# Patient Record
Sex: Female | Born: 1990
Health system: Southern US, Community
[De-identification: ages and names within clinical notes are randomized; demographics above are authoritative.]

## PROBLEM LIST (undated history)

## (undated) ENCOUNTER — Inpatient Hospital Stay (HOSPITAL_COMMUNITY): Payer: Self-pay

## (undated) DIAGNOSIS — R41840 Attention and concentration deficit: Secondary | ICD-10-CM

## (undated) HISTORY — PX: WISDOM TOOTH EXTRACTION: SHX21

---

## 2014-07-12 ENCOUNTER — Emergency Department (HOSPITAL_BASED_OUTPATIENT_CLINIC_OR_DEPARTMENT_OTHER)
Admission: EM | Admit: 2014-07-12 | Discharge: 2014-07-12 | Disposition: A | Discharge status: Home / Self Care | Payer: Managed Care, Other (non HMO) | Attending: Emergency Medicine | Admitting: Emergency Medicine

## 2014-07-12 ENCOUNTER — Encounter (HOSPITAL_BASED_OUTPATIENT_CLINIC_OR_DEPARTMENT_OTHER): Payer: Self-pay | Admitting: Emergency Medicine

## 2014-07-12 ENCOUNTER — Emergency Department (HOSPITAL_BASED_OUTPATIENT_CLINIC_OR_DEPARTMENT_OTHER): Payer: Managed Care, Other (non HMO)

## 2014-07-12 DIAGNOSIS — M79671 Pain in right foot: Secondary | ICD-10-CM | POA: Insufficient documentation

## 2014-07-12 DIAGNOSIS — Z87828 Personal history of other (healed) physical injury and trauma: Secondary | ICD-10-CM | POA: Diagnosis not present

## 2014-07-12 HISTORY — DX: Attention and concentration deficit: R41.840

## 2014-07-12 MED ORDER — IBUPROFEN 600 MG PO TABS: 600.0000 mg | ORAL_TABLET | Freq: Four times a day (QID) | ORAL | Status: DC | PRN

## 2014-07-12 NOTE — ED Provider Notes (Signed)
CSN: [id number]     Arrival date & time 07/12/14  2000 History   First MD Initiated Contact with Patient 07/12/14 2239     Chief Complaint  Patient presents with  . Foot Pain     (Consider location/radiation/quality/duration/timing/severity/associated sxs/prior Treatment) HPI Comments: Patient presents with complaint of right foot pain for the past 4 days. She denies injury. 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 Pain is worse with bearing weight and she states that she is walking on the back of her foot. She has a history of a previous sprain in that foot and ankle. She's been taking ibuprofen and Tylenol without relief. She has been elevating it at night. She has not had appreciable swelling. Pain worsens during the day with activity. No numbness, tingling, weakness in her foot.   Patient is a 23 y.o. female presenting with lower extremity pain. The history is provided by the patient.  Foot Pain Associated symptoms include arthralgias. Pertinent negatives include no joint swelling, numbness or weakness.    Past Medical History  Diagnosis Date  . Attention deficit    History reviewed. No pertinent past surgical history. No family history on file. History  Substance Use Topics  . Smoking status: Never Smoker   . Smokeless tobacco: Not on file  . Alcohol Use: No   OB History   Grav Para Term Preterm Abortions TAB SAB Ect Mult Living                 Review of Systems  Constitutional: Negative for activity change.  Musculoskeletal: Positive for arthralgias and gait problem. Negative for back pain and joint swelling.  Skin: Negative for wound.  Neurological: Negative for weakness and numbness.      Allergies  Review of patient's allergies indicates no known allergies.  Home Medications   Prior to Admission medications   Medication Sig Start Date End Date Taking? Authorizing Provider  ibuprofen (ADVIL,MOTRIN) 600 MG tablet Take 1 tablet (600 mg total)  by mouth every 6 (six) hours as needed. 07/12/14   Renne CriglerJoshua Dvonte Gatliff, PA-C   BP 122/74  Pulse 93  Temp(Src) 98.1 F (36.7 C) (Oral)  Resp 20  Ht 5\' 5"  (1.651 m)  Wt 160 lb (72.576 kg)  BMI 26.63 kg/m2  SpO2 100%  LMP 06/28/2014 Physical Exam  Nursing note and vitals reviewed. Constitutional: She appears well-developed and well-nourished.  HENT:  Head: Normocephalic and atraumatic.  Eyes: Pupils are equal, round, and reactive to light.  Neck: Normal range of motion. Neck supple.  Cardiovascular: Exam reveals no decreased pulses.   Pulses:      Dorsalis pedis pulses are 2+ on the right side.       Posterior tibial pulses are 2+ on the right side.  Musculoskeletal: She exhibits tenderness. She exhibits no edema.       Right knee: Normal.       Right ankle: Normal. No tenderness.       Right lower leg: Normal.       Right foot: She exhibits tenderness. She exhibits normal range of motion, no swelling, normal capillary refill and no deformity.       Feet:  Neurological: She is alert. No sensory deficit.  Motor, sensation, and vascular distal to the injury is fully intact.   Skin: Skin is warm and dry.  Psychiatric: She has a normal mood and affect.    ED Course  Procedures (including critical care time) Labs Review  Labs Reviewed - No data to display  Imaging Review Dg Foot Complete Right  07/12/2014   CLINICAL DATA:  Right foot pain beginning 07/08/2014. No known injury. Initial encounter.  EXAM: RIGHT FOOT COMPLETE - 3+ VIEW  COMPARISON:  None.  FINDINGS: Imaged bones, joints and soft tissues appear normal.  IMPRESSION: Negative exam.   Electronically Signed   By: Drusilla Kannerhomas  Dalessio M.D.   On: 07/12/2014 21:00     EKG Interpretation None      Patient seen and examined.    Vital signs reviewed and are as follows: BP 122/74  Pulse 93  Temp(Src) 98.1 F (36.7 C) (Oral)  Resp 20  Ht 5\' 5"  (1.651 m)  Wt 160 lb (72.576 kg)  BMI 26.63 kg/m2  SpO2 100%  LMP  06/28/2014  Informed of x-ray results. Counseled on rice protocol and NSAIDs. Encouraged orthopedic followup for definitive care and management.  MDM   Final diagnoses:  Right foot pain   Patient with right foot pain, likely inflammatory due to overuse. No signs of cellulitis. Doubt plantar fasciitis. X-rays negative. Continue NSAIDs, rice protocol with PCP/orthopedic followup. Foot is neurovascularly intact. No orthopedic emergency suspected.    Renne CriglerJoshua Shadai Mcclane, PA-C 07/13/14 0002

## 2014-07-12 NOTE — Discharge Instructions (Signed)
Please read and follow all provided instructions.  Your diagnoses today include:  1. Right foot pain     Tests performed today include:  An x-ray of the affected area - does NOT show any broken bones  Vital signs. See below for your results today.   Medications prescribed:   Ibuprofen (Motrin, Advil) - anti-inflammatory pain medication  Do not exceed 600mg  ibuprofen every 6 hours, take with food  You have been prescribed an anti-inflammatory medication or NSAID. Take with food. Take smallest effective dose for the shortest duration needed for your pain. Stop taking if you experience stomach pain or vomiting.   Take any prescribed medications only as directed.  Home care instructions:   Follow any educational materials contained in this packet  Follow R.I.C.E. Protocol:  R - rest your injury   I  - use ice on injury without applying directly to skin  C - compress injury with bandage or splint  E - elevate the injury as much as possible  Follow-up instructions: Please follow-up with your primary care provider or the provided orthopedic physician (bone specialist) if you continue to have significant pain or trouble walking in 1 week. In this case you may have a severe injury that requires further care.   Return instructions:   Please return if your toes are numb or tingling, appear gray or blue, or you have severe pain (also elevate leg and loosen splint or wrap if you were given one)  Please return to the Emergency Department if you experience worsening symptoms.   Please return if you have any other emergent concerns.  Additional Information:  Your vital signs today were: BP 122/74   Pulse 93   Temp(Src) 98.1 F (36.7 C) (Oral)   Resp 20   Ht 5\' 5"  (1.651 m)   Wt 160 lb (72.576 kg)   BMI 26.63 kg/m2   SpO2 100%   LMP 06/28/2014 If your blood pressure (BP) was elevated above 135/85 this visit, please have this repeated by your doctor within one  month. --------------

## 2014-07-12 NOTE — ED Notes (Signed)
Right foot pain x 4 days. No known injury.

## 2014-07-13 NOTE — ED Provider Notes (Signed)
Medical screening examination/treatment/procedure(s) were performed by non-physician practitioner and as supervising physician I was immediately available for consultation/collaboration.   EKG Interpretation None       Gavyn Ybarra, MD 07/13/14 0121 

## 2016-10-17 ENCOUNTER — Emergency Department (HOSPITAL_BASED_OUTPATIENT_CLINIC_OR_DEPARTMENT_OTHER): Payer: Managed Care, Other (non HMO)

## 2016-10-17 ENCOUNTER — Encounter (HOSPITAL_BASED_OUTPATIENT_CLINIC_OR_DEPARTMENT_OTHER): Payer: Self-pay | Admitting: Emergency Medicine

## 2016-10-17 ENCOUNTER — Emergency Department (HOSPITAL_BASED_OUTPATIENT_CLINIC_OR_DEPARTMENT_OTHER)
Admission: EM | Admit: 2016-10-17 | Discharge: 2016-10-17 | Disposition: A | Discharge status: Home / Self Care | Payer: Managed Care, Other (non HMO) | Attending: Emergency Medicine | Admitting: Emergency Medicine

## 2016-10-17 DIAGNOSIS — M5441 Lumbago with sciatica, right side: Secondary | ICD-10-CM | POA: Diagnosis not present

## 2016-10-17 DIAGNOSIS — M545 Low back pain: Secondary | ICD-10-CM | POA: Diagnosis present

## 2016-10-17 LAB — URINALYSIS, ROUTINE W REFLEX MICROSCOPIC
Bilirubin Urine: NEGATIVE
GLUCOSE, UA: NEGATIVE mg/dL
KETONES UR: NEGATIVE mg/dL
Nitrite: NEGATIVE
PROTEIN: NEGATIVE mg/dL
Specific Gravity, Urine: 1.017 (ref 1.005–1.030)
pH: 6 (ref 5.0–8.0)

## 2016-10-17 LAB — URINALYSIS, MICROSCOPIC (REFLEX)

## 2016-10-17 LAB — PREGNANCY, URINE: PREG TEST UR: NEGATIVE

## 2016-10-17 MED ORDER — KETOROLAC TROMETHAMINE 30 MG/ML IJ SOLN
30.0000 mg | Freq: Once | INTRAMUSCULAR | Status: AC
  Administered 2016-10-17: 30 mg via INTRAMUSCULAR
  Filled 2016-10-17: qty 1

## 2016-10-17 MED ORDER — PREDNISONE 20 MG PO TABS
40.0000 mg | ORAL_TABLET | Freq: Once | ORAL | Status: AC
  Administered 2016-10-17: 40 mg via ORAL
  Filled 2016-10-17: qty 2

## 2016-10-17 MED ORDER — HYDROCODONE-ACETAMINOPHEN 5-325 MG PO TABS: 1.0000 | ORAL_TABLET | Freq: Four times a day (QID) | ORAL | 0 refills | Status: DC | PRN

## 2016-10-17 MED ORDER — HYDROCODONE-ACETAMINOPHEN 5-325 MG PO TABS
1.0000 | ORAL_TABLET | Freq: Once | ORAL | Status: AC
  Administered 2016-10-17: 1 via ORAL
  Filled 2016-10-17: qty 1

## 2016-10-17 MED ORDER — PREDNISONE 20 MG PO TABS: 40.0000 mg | ORAL_TABLET | Freq: Every day | ORAL | 0 refills | Status: DC

## 2016-10-17 NOTE — ED Provider Notes (Signed)
[facility]   CSN: [id number] Arrival date & time: 10/17/16  1408     History   Chief Complaint Chief Complaint  Patient presents with  . Back Pain    HPI Cheryl Lambert is a 26 y.o. female.  26 year old Caucasian female with no significant past medical history presents to the ED today with complaint of right-sided low back pain. Patient states that her symptoms started approximately 3 days ago. The pain radiates to her right buttocks and down her right leg. She denies any injury. Patient states that she had similar episode beginning of December. She saw a chiropractor at that time. Symptoms resolved and then returned 3 days ago. The patient has tried a muscle relaxer she had a home with out any relief. Patient denies any heavy lifting or straining at work. Moving, stepping, bending, twisting makes the pain worse. Sitting on her right side makes the pain worse. Nothing makes the pain better. She denies any fevers, urinary retention, loss of bowel or bladder, saddle paresthesias, lower extremity weakness, lower extremity paresthesias, night sweats, history of IV drug use, history of cancer. Patient denies any fever, chills, headache, vision changes, lightheadedness, dizziness, chest pain, shortness of breath, abdominal pain, nausea, emesis, urinary symptoms, change in bowel habits, vaginal bleeding, vaginal discharge, lower show any numbness or tingling.      Past Medical History:  Diagnosis Date  . Attention deficit     There are no active problems to display for this patient.   History reviewed. No pertinent surgical history.  OB History    No data available       Home Medications    Prior to Admission medications   Medication Sig Start Date End Date Taking? Authorizing Provider  lisdexamfetamine (VYVANSE) 20 MG capsule Take 20 mg by mouth daily.   Yes Historical Provider, MD  ibuprofen (ADVIL,MOTRIN) 600 MG tablet Take 1 tablet (600 mg total) by  mouth every 6 (six) hours as needed. 07/12/14   Renne Crigler, PA-C    Family History No family history on file.  Social History Social History  Substance Use Topics  . Smoking status: Never Smoker  . Smokeless tobacco: Never Used  . Alcohol use No     Allergies   Patient has no known allergies.   Review of Systems Review of Systems  Constitutional: Negative for chills and fever.  Eyes: Negative for visual disturbance.  Respiratory: Negative for cough and shortness of breath.   Cardiovascular: Negative for chest pain and leg swelling.  Gastrointestinal: Negative for abdominal pain, diarrhea, nausea and vomiting.  Genitourinary: Negative for dysuria, flank pain, frequency, hematuria and urgency.  Musculoskeletal: Positive for back pain. Negative for neck pain and neck stiffness. Arthralgias: right lumbar region.  Skin: Negative for color change.  Neurological: Negative for dizziness, syncope, weakness, light-headedness, numbness and headaches.  All other systems reviewed and are negative.    Physical Exam Updated Vital Signs BP 128/97 (BP Location: Left Arm)   Pulse 115   Temp 98.2 F (36.8 C) (Oral)   Resp 20   Ht 5\' 5"  (1.651 m)   Wt 81.6 kg   LMP 10/03/2016   SpO2 100%   BMI 29.95 kg/m   Physical Exam  Constitutional: She is oriented to person, place, and time. She appears well-developed and well-nourished. No distress.  HENT:  Head: Normocephalic and atraumatic.  Eyes: Pupils are equal, round, and reactive to light. Right eye exhibits no discharge. Left eye exhibits no discharge.  No scleral icterus.  Neck: Normal range of motion. Neck supple.  No midline C-spine tenderness. Full range of motion. No deformities or step-offs noted.  Cardiovascular: Regular rhythm and intact distal pulses.   Patient was tachycardic in triage but on my exam heart rate was 103. This is likely due to pain.  Pulmonary/Chest: Effort normal and breath sounds normal. No respiratory  distress.  Abdominal: Soft. Bowel sounds are normal. She exhibits no distension. There is no tenderness. There is no rebound and no guarding.  Musculoskeletal: Normal range of motion.  No midline T-spine or L-spine tenderness. No deformities or step-offs noted. Full range of motion. She does have tenderness to palpation of the right lumbar paraspinal musculature. Pain radiates down to the right buttocks. She has a positive straight leg raise test on the right that reproduces pain that shoots down her buttocks. Pain with movement. Patient is able to ambulate with normal gait. Strength 5 out of 5 in lower extremities bilaterally. Cap refill normal. Sensation intact.  Lymphadenopathy:    She has no cervical adenopathy.  Neurological: She is alert and oriented to person, place, and time.  Skin: Skin is warm and dry. Capillary refill takes less than 2 seconds. No pallor.  Nursing note and vitals reviewed.    ED Treatments / Results  Labs (all labs ordered are listed, but only abnormal results are displayed) Labs Reviewed  URINALYSIS, ROUTINE W REFLEX MICROSCOPIC - Abnormal; Notable for the following:       Result Value   APPearance CLOUDY (*)    Hgb urine dipstick SMALL (*)    Leukocytes, UA SMALL (*)    All other components within normal limits  URINALYSIS, MICROSCOPIC (REFLEX) - Abnormal; Notable for the following:    Bacteria, UA RARE (*)    Squamous Epithelial / LPF 0-5 (*)    All other components within normal limits  PREGNANCY, URINE    EKG  EKG Interpretation None       Radiology Dg Lumbar Spine Complete  Result Date: 10/17/2016 CLINICAL DATA:  Patient c/o right sided back pains since 10/14/16, denies any known injury, states that she has previously had back pain off/on but this has been more severe EXAM: LUMBAR SPINE - COMPLETE 4+ VIEW COMPARISON:  None. FINDINGS: There is dextroscoliosis of the lumbar spine, measuring approximately 20 degrees. Osseous alignment is otherwise  normal. Bone mineralization is normal. No fracture line or displaced fracture fragment identified. No acute or suspicious osseous lesion. No evidence of pars interarticularis defect. No degenerative change. Paravertebral soft tissues are unremarkable. Fairly large amount of stool and gas noted in the right colon. IMPRESSION: 1. Scoliosis, approximately 20 degrees. No acute osseous abnormality seen. 2. Fairly large amount of stool within the right colon (constipation? ). Electronically Signed   By: Bary Richard M.D.   On: 10/17/2016 15:36    Procedures Procedures (including critical care time)  Medications Ordered in ED Medications  HYDROcodone-acetaminophen (NORCO/VICODIN) 5-325 MG per tablet 1 tablet (1 tablet Oral Given 10/17/16 1519)  ketorolac (TORADOL) 30 MG/ML injection 30 mg (30 mg Intramuscular Given 10/17/16 1551)  predniSONE (DELTASONE) tablet 40 mg (40 mg Oral Given 10/17/16 1603)     Initial Impression / Assessment and Plan / ED Course  I have reviewed the triage vital signs and the nursing notes.  Pertinent labs & imaging results that were available during my care of the patient were reviewed by me and considered in my medical decision making (see chart for details).  Clinical Course   Patient with back pain.  No neurological deficits and normal neuro exam.  Patient can walk but states is painful.  No loss of bowel or bladder control.  No concern for cauda equina.  No fever, night sweats, weight loss, h/o cancer, IVDU.  Moving makes the pain worse. Patient has history and seeing chiropractor for same. Patient denies any urinary symptoms or vaginal symptoms. She denies any abdominal pains or fever. Urine without signs of infection. Small amount of hemoglobin was noted. Pain possibly due to kidney stone however patient has no urinary symptoms. She denies any nausea or vomiting. Pain is worse with palpation of the right paraspinal muscular of the lumbar spine. Likely sciatic pain. X-ray  reveals no acute bony amount. She does have scoliosis noted. Pt has history. X-ray also notes large amount of stool. Patient was given pain medicine and Toradol in the ED. She was discharged home with a course of prednisone. Patient was slightly tachycardic in the ED. Heart rate on discharge 106. Reviewed patient's prior visits and has had an elevated heart rate at that time. This is likely due to pain. Patient is afebrile in the ED. RICE protocol and pain medicine indicated and discussed with patient. Encouraged patient to return to the ED if she develops any urinary symptoms, fever, nausea, vomiting, abdominal pain. I have encouraged her follow with her primary care doctor. Pt is hemodynamically stable, in NAD, & able to ambulate in the ED. Pain has been managed & has no complaints prior to dc. Pt is comfortable with above plan and is stable for discharge at this time. All questions were answered prior to disposition. Strict return precautions for f/u to the ED were discussed.    Final Clinical Impressions(s) / ED Diagnoses   Final diagnoses:  Acute right-sided low back pain with right-sided sciatica    New Prescriptions Discharge Medication List as of 10/17/2016  4:01 PM    START taking these medications   Details  HYDROcodone-acetaminophen (NORCO/VICODIN) 5-325 MG tablet Take 1-2 tablets by mouth every 6 (six) hours as needed., Starting Wed 10/17/2016, Print    predniSONE (DELTASONE) 20 MG tablet Take 2 tablets (40 mg total) by mouth daily with breakfast., Starting Wed 10/17/2016, Print         Rise MuKenneth T Leaphart, PA-C 10/17/16 1619    Vanetta MuldersScott Zackowski, MD 10/23/16 1735

## 2016-10-17 NOTE — ED Notes (Signed)
ED Provider at bedside. 

## 2016-10-17 NOTE — ED Notes (Signed)
Patient transported to X-ray 

## 2016-10-17 NOTE — Discharge Instructions (Signed)
Your urine shows no signs of infection. Your x-ray shows no fractures. Does show scoliosis of the lumbar spine. There are is moderate amount of stool in her colon. I would encourage MiraLAX in stool softener to help with constipation. Please take the prednisone as prescribed for the next 4 days starting tomorrow. You  may take the pain medicine as needed. Please use a heating pad or soak in warm water with Epsom salt. You may take ibuprofen starting tomorrow. Follow up with her primary care doctor. Return to the ED if he develops any urinary symptoms, fever, abdominal pain, vaginal symptoms or for the reason.

## 2016-10-17 NOTE — ED Triage Notes (Signed)
R low back pain since Sunday, radiating into buttock, denies injury.

## 2018-06-01 ENCOUNTER — Encounter (HOSPITAL_BASED_OUTPATIENT_CLINIC_OR_DEPARTMENT_OTHER): Payer: Self-pay | Admitting: Emergency Medicine

## 2018-06-01 ENCOUNTER — Emergency Department (HOSPITAL_BASED_OUTPATIENT_CLINIC_OR_DEPARTMENT_OTHER)
Admission: EM | Admit: 2018-06-01 | Discharge: 2018-06-01 | Disposition: A | Discharge status: Home / Self Care | Payer: 59 | Attending: Emergency Medicine | Admitting: Emergency Medicine

## 2018-06-01 ENCOUNTER — Other Ambulatory Visit: Payer: Self-pay

## 2018-06-01 DIAGNOSIS — Y999 Unspecified external cause status: Secondary | ICD-10-CM | POA: Insufficient documentation

## 2018-06-01 DIAGNOSIS — X501XXA Overexertion from prolonged static or awkward postures, initial encounter: Secondary | ICD-10-CM | POA: Insufficient documentation

## 2018-06-01 DIAGNOSIS — Y93K1 Activity, walking an animal: Secondary | ICD-10-CM | POA: Insufficient documentation

## 2018-06-01 DIAGNOSIS — S3992XA Unspecified injury of lower back, initial encounter: Secondary | ICD-10-CM | POA: Diagnosis present

## 2018-06-01 DIAGNOSIS — S39012A Strain of muscle, fascia and tendon of lower back, initial encounter: Secondary | ICD-10-CM | POA: Insufficient documentation

## 2018-06-01 DIAGNOSIS — Y929 Unspecified place or not applicable: Secondary | ICD-10-CM | POA: Insufficient documentation

## 2018-06-01 DIAGNOSIS — Z79899 Other long term (current) drug therapy: Secondary | ICD-10-CM | POA: Diagnosis not present

## 2018-06-01 MED ORDER — CYCLOBENZAPRINE HCL 10 MG PO TABS
10.0000 mg | ORAL_TABLET | Freq: Once | ORAL | Status: AC
  Administered 2018-06-01: 10 mg via ORAL
  Filled 2018-06-01: qty 1

## 2018-06-01 MED ORDER — IBUPROFEN 800 MG PO TABS
800.0000 mg | ORAL_TABLET | Freq: Once | ORAL | Status: AC
  Administered 2018-06-01: 800 mg via ORAL
  Filled 2018-06-01: qty 1

## 2018-06-01 MED ORDER — CYCLOBENZAPRINE HCL 10 MG PO TABS: 10.0000 mg | ORAL_TABLET | Freq: Two times a day (BID) | ORAL | 0 refills | Status: DC | PRN

## 2018-06-01 MED ORDER — ACETAMINOPHEN 325 MG PO TABS
650.0000 mg | ORAL_TABLET | Freq: Once | ORAL | Status: AC
  Administered 2018-06-01: 650 mg via ORAL
  Filled 2018-06-01: qty 2

## 2018-06-01 NOTE — ED Triage Notes (Signed)
Low back pain since 1230 after bending over.

## 2018-06-01 NOTE — Discharge Instructions (Signed)
You have strained the muscles in your lower back.  Take 800 mg ibuprofen every 6 hours for pain.  He can also take Tylenol every 6 hours.  Apply ice over your left lower back at least 3 times a day for 15 minutes at a time.  Engage in back exercises, I have attached an information guide to this paperwork.  I have written you prescription for muscle relaxer, remember that this medicine makes you drowsy and you should not drive, work or drink alcohol while taking it.  Follow-up with your regular doctor if your symptoms are not improving.  Return to the emergency department if you have any new or concerning symptoms like numbness in your feet or legs, weakness, loss of bowel or bladder control.

## 2018-06-01 NOTE — ED Provider Notes (Signed)
[facility] EMERGENCY DEPARTMENT Provider Note   CSN: [id number] Arrival date & time: 06/01/18  1712     History   Chief Complaint Chief Complaint  Patient presents with  . Back Pain    HPI Cheryl Lambert is a 27 y.o. female.  HPI   Cheryl Lambert is a 27yo female with no significant past medical history who presents to the emergency department for evaluation of right-sided lower back pain.  Patient reports that she was bending over to put a lesion on her dog when she suddenly felt a sharp sensation in her midline lower back.  Since that time she has had pain over the right lower back.  Pain feels sharp and tight.  Is an 8-9/10 in severity.  Worsened with movement including walking, bending, twisting at the hip.  She tried a heating pad but this did not help her symptoms.  No over-the-counter medications.  Denies numbness, weakness, loss of bowel or bladder control, saddle anesthesia, radiation down the legs, fever, chills, dysuria, urinary frequency, hematuria, abdominal pain, vomiting. No hx of cancer or ivdu.   Past Medical History:  Diagnosis Date  . Attention deficit     There are no active problems to display for this patient.   History reviewed. No pertinent surgical history.   OB History   None      Home Medications    Prior to Admission medications   Medication Sig Start Date End Date Taking? Authorizing Provider  HYDROcodone-acetaminophen (NORCO/VICODIN) 5-325 MG tablet Take 1-2 tablets by mouth every 6 (six) hours as needed. 10/17/16   Rise Mu, PA-C  ibuprofen (ADVIL,MOTRIN) 600 MG tablet Take 1 tablet (600 mg total) by mouth every 6 (six) hours as needed. 07/12/14   Renne Crigler, PA-C  lisdexamfetamine (VYVANSE) 20 MG capsule Take 20 mg by mouth daily.    [provider]  predniSONE (DELTASONE) 20 MG tablet Take 2 tablets (40 mg total) by mouth daily with breakfast. 10/17/16   Leaphart, Lynann Beaver, PA-C    Family History No family  history on file.  Social History Social History   Tobacco Use  . Smoking status: Never Smoker  . Smokeless tobacco: Never Used  Substance Use Topics  . Alcohol use: No  . Drug use: No     Allergies   Patient has no known allergies.   Review of Systems Review of Systems  Constitutional: Negative for chills and fever.  Gastrointestinal: Negative for abdominal pain, nausea and vomiting.  Genitourinary: Negative for difficulty urinating, dysuria, flank pain, frequency and hematuria.  Musculoskeletal: Positive for back pain and gait problem (painful).  Skin: Negative for color change.  Neurological: Negative for weakness and numbness.  Psychiatric/Behavioral: Negative for agitation.     Physical Exam Updated Vital Signs BP (!) 140/99 (BP Location: Right Arm)   Pulse 97   Temp 97.7 F (36.5 C) (Oral)   Resp 20   Ht 5\' 5"  (1.651 m)   Wt 86.2 kg   LMP 05/31/2018   SpO2 100%   BMI 31.62 kg/m   Physical Exam  Constitutional: She appears well-developed and well-nourished. No distress.  NAD  HENT:  Head: Normocephalic and atraumatic.  Eyes: Right eye exhibits no discharge. Left eye exhibits no discharge.  Pulmonary/Chest: Effort normal. No respiratory distress.  Abdominal: Soft. Bowel sounds are normal. There is no tenderness. There is no guarding.  No CVA tenderness.   Musculoskeletal:  No midline t-spine or l-spine tenderness. Tender to palpation over right-sided  paraspinal muscles of the lumbar spine. No overlying rash or bruising. Strength 5/5 in bilateral knee flexion/extension and ankle dorsiflexion/plantarflexion. DP pulses 2+ and symmetric bilaterally.   Neurological: She is alert. Coordination normal.  Gait normal in coordination and balance. Sensation to light touch intact in distal bilateral LE.   Skin: Skin is warm and dry. Capillary refill takes less than 2 seconds. She is not diaphoretic.  Psychiatric: She has a normal mood and affect. Her behavior is  normal.  Nursing note and vitals reviewed.   ED Treatments / Results  Labs (all labs ordered are listed, but only abnormal results are displayed) Labs Reviewed - No data to display  EKG None  Radiology No results found.  Procedures Procedures (including critical care time)  Medications Ordered in ED Medications  cyclobenzaprine (FLEXERIL) tablet 10 mg (10 mg Oral Given 06/01/18 1823)  ibuprofen (ADVIL,MOTRIN) tablet 800 mg (800 mg Oral Given 06/01/18 1824)  acetaminophen (TYLENOL) tablet 650 mg (650 mg Oral Given 06/01/18 1823)     Initial Impression / Assessment and Plan / ED Course  I have reviewed the triage vital signs and the nursing notes.  Pertinent labs & imaging results that were available during my care of the patient were reviewed by me and considered in my medical decision making (see chart for details).     Presentation and exam consistent with lumbar strain. No neurological deficits and normal neuro exam.  Patient can walk but states is painful. No loss of bowel or bladder control. No concern for cauda equina.  No fever, night sweats, weight loss, h/o cancer, IVDU. RICE protocol and pain medicine indicated and discussed with patient. Discussed follow up with PCP if symptoms are not improving. Discussed return precautions. Patient agrees and appears reliable.  Final Clinical Impressions(s) / ED Diagnoses   Final diagnoses:  Strain of lumbar region, initial encounter    ED Discharge Orders         Ordered    cyclobenzaprine (FLEXERIL) 10 MG tablet  2 times daily PRN     06/01/18 1833           Kellie Shropshire, PA-C 06/01/18 1834    Tegeler, Canary Brim, MD 06/02/18 [phone]

## 2019-05-16 ENCOUNTER — Emergency Department (HOSPITAL_BASED_OUTPATIENT_CLINIC_OR_DEPARTMENT_OTHER): Payer: 59

## 2019-05-16 ENCOUNTER — Encounter (HOSPITAL_BASED_OUTPATIENT_CLINIC_OR_DEPARTMENT_OTHER): Payer: Self-pay | Admitting: Emergency Medicine

## 2019-05-16 ENCOUNTER — Emergency Department (HOSPITAL_BASED_OUTPATIENT_CLINIC_OR_DEPARTMENT_OTHER)
Admission: EM | Admit: 2019-05-16 | Discharge: 2019-05-16 | Disposition: A | Discharge status: Home / Self Care | Payer: 59 | Attending: Emergency Medicine | Admitting: Emergency Medicine

## 2019-05-16 ENCOUNTER — Other Ambulatory Visit: Payer: Self-pay

## 2019-05-16 DIAGNOSIS — S43015A Anterior dislocation of left humerus, initial encounter: Secondary | ICD-10-CM | POA: Insufficient documentation

## 2019-05-16 DIAGNOSIS — W01198A Fall on same level from slipping, tripping and stumbling with subsequent striking against other object, initial encounter: Secondary | ICD-10-CM | POA: Insufficient documentation

## 2019-05-16 DIAGNOSIS — Y998 Other external cause status: Secondary | ICD-10-CM | POA: Insufficient documentation

## 2019-05-16 DIAGNOSIS — Y9301 Activity, walking, marching and hiking: Secondary | ICD-10-CM | POA: Diagnosis not present

## 2019-05-16 DIAGNOSIS — S4992XA Unspecified injury of left shoulder and upper arm, initial encounter: Secondary | ICD-10-CM | POA: Diagnosis present

## 2019-05-16 DIAGNOSIS — Y92838 Other recreation area as the place of occurrence of the external cause: Secondary | ICD-10-CM | POA: Insufficient documentation

## 2019-05-16 MED ORDER — HYDROMORPHONE HCL 1 MG/ML IJ SOLN
1.0000 mg | Freq: Once | INTRAMUSCULAR | Status: AC
Start: 2019-05-16 — End: 2019-05-16
  Administered 2019-05-16: 1 mg via INTRAVENOUS
  Filled 2019-05-16: qty 1

## 2019-05-16 MED ORDER — ONDANSETRON HCL 4 MG/2ML IJ SOLN
INTRAMUSCULAR | Status: AC
  Administered 2019-05-16: 4 mg via INTRAVENOUS
  Filled 2019-05-16: qty 2

## 2019-05-16 MED ORDER — HYDROMORPHONE HCL 1 MG/ML IJ SOLN
1.0000 mg | Freq: Once | INTRAMUSCULAR | Status: AC
  Administered 2019-05-16: 1 mg via INTRAVENOUS
  Filled 2019-05-16: qty 1

## 2019-05-16 MED ORDER — ONDANSETRON HCL 4 MG/2ML IJ SOLN
4.0000 mg | Freq: Once | INTRAMUSCULAR | Status: AC
  Administered 2019-05-16: 22:00:00 4 mg via INTRAVENOUS

## 2019-05-16 MED ORDER — IBUPROFEN 600 MG PO TABS: 600.0000 mg | ORAL_TABLET | Freq: Four times a day (QID) | ORAL | 0 refills | Status: DC | PRN

## 2019-05-16 NOTE — ED Notes (Signed)
Portable xray at bedside.

## 2019-05-16 NOTE — ED Provider Notes (Signed)
[facility] EMERGENCY DEPARTMENT Provider Note   CSN: [id number] Arrival date & time: 05/16/19  2042     History   Chief Complaint Chief Complaint  Patient presents with  . Fall  . Shoulder Pain    HPI Cheryl Lambert is a 28 y.o. female.     The history is provided by the patient. No language interpreter was used.  Fall  Shoulder Pain Associated symptoms: no fever      28 year old female presenting for evaluation of recent fall.  Patient report she was outside enjoying a bonfire when she got up from her chair, and accidentally tripped on a tree stump, fell striking her left shoulder against the deck.  She denies hitting her head or loss of consciousness.  She report acute onset of 9 out of 10 sharp pain about her left shoulder worsened with movement.  She is unable to move her shoulder.  She does not complain of any pain to her neck or her elbow.  She did report history of recurrent shoulder dislocation to the same shoulder in the past while she was doing cheerleading in high school.  She last ate 2 hours ago.  Does not complain of any headache, neck pain, chest pain or trouble breathing.  She denies any elbow or wrist pain.  Past Medical History:  Diagnosis Date  . Attention deficit     There are no active problems to display for this patient.   History reviewed. No pertinent surgical history.   OB History   No obstetric history on file.      Home Medications    Prior to Admission medications   Medication Sig Start Date End Date Taking? Authorizing Provider  cyclobenzaprine (FLEXERIL) 10 MG tablet Take 1 tablet (10 mg total) by mouth 2 (two) times daily as needed for muscle spasms. 06/01/18   Kellie ShropshireShrosbree, Emily J, PA-C  HYDROcodone-acetaminophen (NORCO/VICODIN) 5-325 MG tablet Take 1-2 tablets by mouth every 6 (six) hours as needed. 10/17/16   Rise MuLeaphart, Kenneth T, PA-C  ibuprofen (ADVIL,MOTRIN) 600 MG tablet Take 1 tablet (600 mg total) by mouth every 6 (six)  hours as needed. 07/12/14   Renne CriglerGeiple, Joshua, PA-C  lisdexamfetamine (VYVANSE) 20 MG capsule Take 20 mg by mouth daily.    [provider]  predniSONE (DELTASONE) 20 MG tablet Take 2 tablets (40 mg total) by mouth daily with breakfast. 10/17/16   Leaphart, Lynann BeaverKenneth T, PA-C    Family History No family history on file.  Social History Social History   Tobacco Use  . Smoking status: Never Smoker  . Smokeless tobacco: Never Used  Substance Use Topics  . Alcohol use: No  . Drug use: No     Allergies   Patient has no known allergies.   Review of Systems Review of Systems  Constitutional: Negative for fever.  Musculoskeletal: Positive for arthralgias.  Skin: Negative for wound.  Neurological: Negative for numbness.     Physical Exam Updated Vital Signs BP 140/90   Pulse 97   Temp 98.4 F (36.9 C)   Resp 18   Ht 5\' 5"  (1.651 m)   Wt 86.2 kg   LMP 05/02/2019   SpO2 100%   BMI 31.62 kg/m   Physical Exam Vitals signs and nursing note reviewed.  Constitutional:      General: She is not in acute distress.    Appearance: She is well-developed. She is obese.  HENT:     Head: Atraumatic.  Eyes:  Conjunctiva/sclera: Conjunctivae normal.  Neck:     Musculoskeletal: Neck supple.  Musculoskeletal:        General: Tenderness (Left shoulder: Tenderness about the deltoid, and at the neck of the shoulder with decreased range of motion but no overlying skin changes.  Possible close deformity.) present.     Comments: Left elbow nontender.  Skin:    Findings: No rash.  Neurological:     Mental Status: She is alert.      ED Treatments / Results  Labs (all labs ordered are listed, but only abnormal results are displayed) Labs Reviewed - No data to display  EKG None  Radiology Dg Shoulder Left  Result Date: 05/16/2019 CLINICAL DATA:  28 year old female status post reduction of the previously seen dislocated left shoulder. EXAM: LEFT SHOULDER - 2+ VIEW  COMPARISON:  Earlier radiograph dated 05/16/2019 FINDINGS: There has been interval reduction of previously seen dislocated left shoulder now appearing in anatomic alignment on this single provided image. Faint area of lucency in the lateral humeral head may represent a Hill-Sachs injury. The soft tissues are unremarkable. IMPRESSION: Interval reduction of the previously seen dislocated left shoulder. Electronically Signed   By: Elgie CollardArash  Radparvar M.D.   On: 05/16/2019 22:38   Dg Shoulder Left  Result Date: 05/16/2019 CLINICAL DATA:  Left shoulder pain and decreased range of motion following a fall. EXAM: LEFT SHOULDER - 2+ VIEW COMPARISON:  None. FINDINGS: Anterior, inferior dislocation of the humeral head. Probable mild Hill-Sachs deformity. IMPRESSION: Anterior, inferior dislocation of the humeral head with a probable mild Hill-Sachs deformity. Electronically Signed   By: Beckie SaltsSteven  Reid M.D.   On: 05/16/2019 21:17    Procedures .Ortho Injury Treatment  Date/Time: 05/16/2019 10:20 PM Performed by: Fayrene Helperran, Anesha Hackert, PA-C Authorized by: Fayrene Helperran, Honest Vanleer, PA-C   Consent:    Consent obtained:  Verbal   Consent given by:  Patient   Risks discussed:  Recurrent dislocation   Alternatives discussed:  Alternative treatment and referralInjury location: shoulder Location details: left shoulder Injury type: dislocation Dislocation type: anterior Hill-Sachs deformity: yes Chronicity: recurrent Pre-procedure neurovascular assessment: neurovascularly intact Pre-procedure distal perfusion: normal Pre-procedure neurological function: normal Pre-procedure range of motion: reduced  Anesthesia: Local anesthesia used: no  Patient sedated: NoManipulation performed: yes Reduction method: FARES technique. Reduction successful: yes X-ray confirmed reduction: yes Immobilization: sling Post-procedure distal perfusion: normal Post-procedure neurological function: normal Post-procedure range of motion: improved Patient  tolerance: patient tolerated the procedure well with no immediate complications    (including critical care time)  Medications Ordered in ED Medications - No data to display   Initial Impression / Assessment and Plan / ED Course  I have reviewed the triage vital signs and the nursing notes.  Pertinent labs & imaging results that were available during my care of the patient were reviewed by me and considered in my medical decision making (see chart for details).        BP 140/90   Pulse 97   Temp 98.4 F (36.9 C)   Resp 18   Ht 5\' 5"  (1.651 m)   Wt 86.2 kg   LMP 05/02/2019   SpO2 100%   BMI 31.62 kg/m    Final Clinical Impressions(s) / ED Diagnoses   Final diagnoses:  Anterior dislocation of left shoulder, initial encounter    ED Discharge Orders         Ordered    ibuprofen (ADVIL) 600 MG tablet  Every 6 hours PRN     05/16/19 2250  9:03 PM Patient here with pain to her left shoulder after a fall.  Possible shoulder dislocation.  Will obtain x-ray.  Pain medication given.  Last menstrual period was 2 weeks ago.  9:41 PM Left shoulder x-ray demonstrate anterior, inferior dislocation of the humeral head with a probable mild Hill-Sachs deformity.  Patient is neurovascular intact.  Pain medication given, will attempt to reduce left shoulder using FARES maneuver  10:21 PM Attempted reduction of L shoulder using FARES maneuver with apparent success.  Will place shoulder in sling and repeat xray.   10:49 PM X-ray of left shoulder shows reductions of dislocation.  Patient placed in sling, appropriate care instruction given.  She is stable for discharge.  Orthopedic referral given as needed.   Domenic Moras, PA-C 05/16/19 2251    Tegeler, Gwenyth Allegra, MD 05/17/19 0001

## 2019-05-16 NOTE — ED Triage Notes (Signed)
Pt was outside enjoying a bon fire when she got up from her chair and tripped over a tree stump falling forward and striking left shoulder on the deck. Denies head or neck pain. Denies LOC

## 2020-02-06 IMAGING — DX LEFT SHOULDER - 2+ VIEW
1 series · 1 of 1 positions shown · non-contrast
Comparison: Earlier radiograph dated 05/16/2019

CLINICAL DATA: 28-year-old female status post reduction of the
previously seen dislocated left shoulder.

EXAM:
LEFT SHOULDER - 2+ VIEW

[shoulder axial]
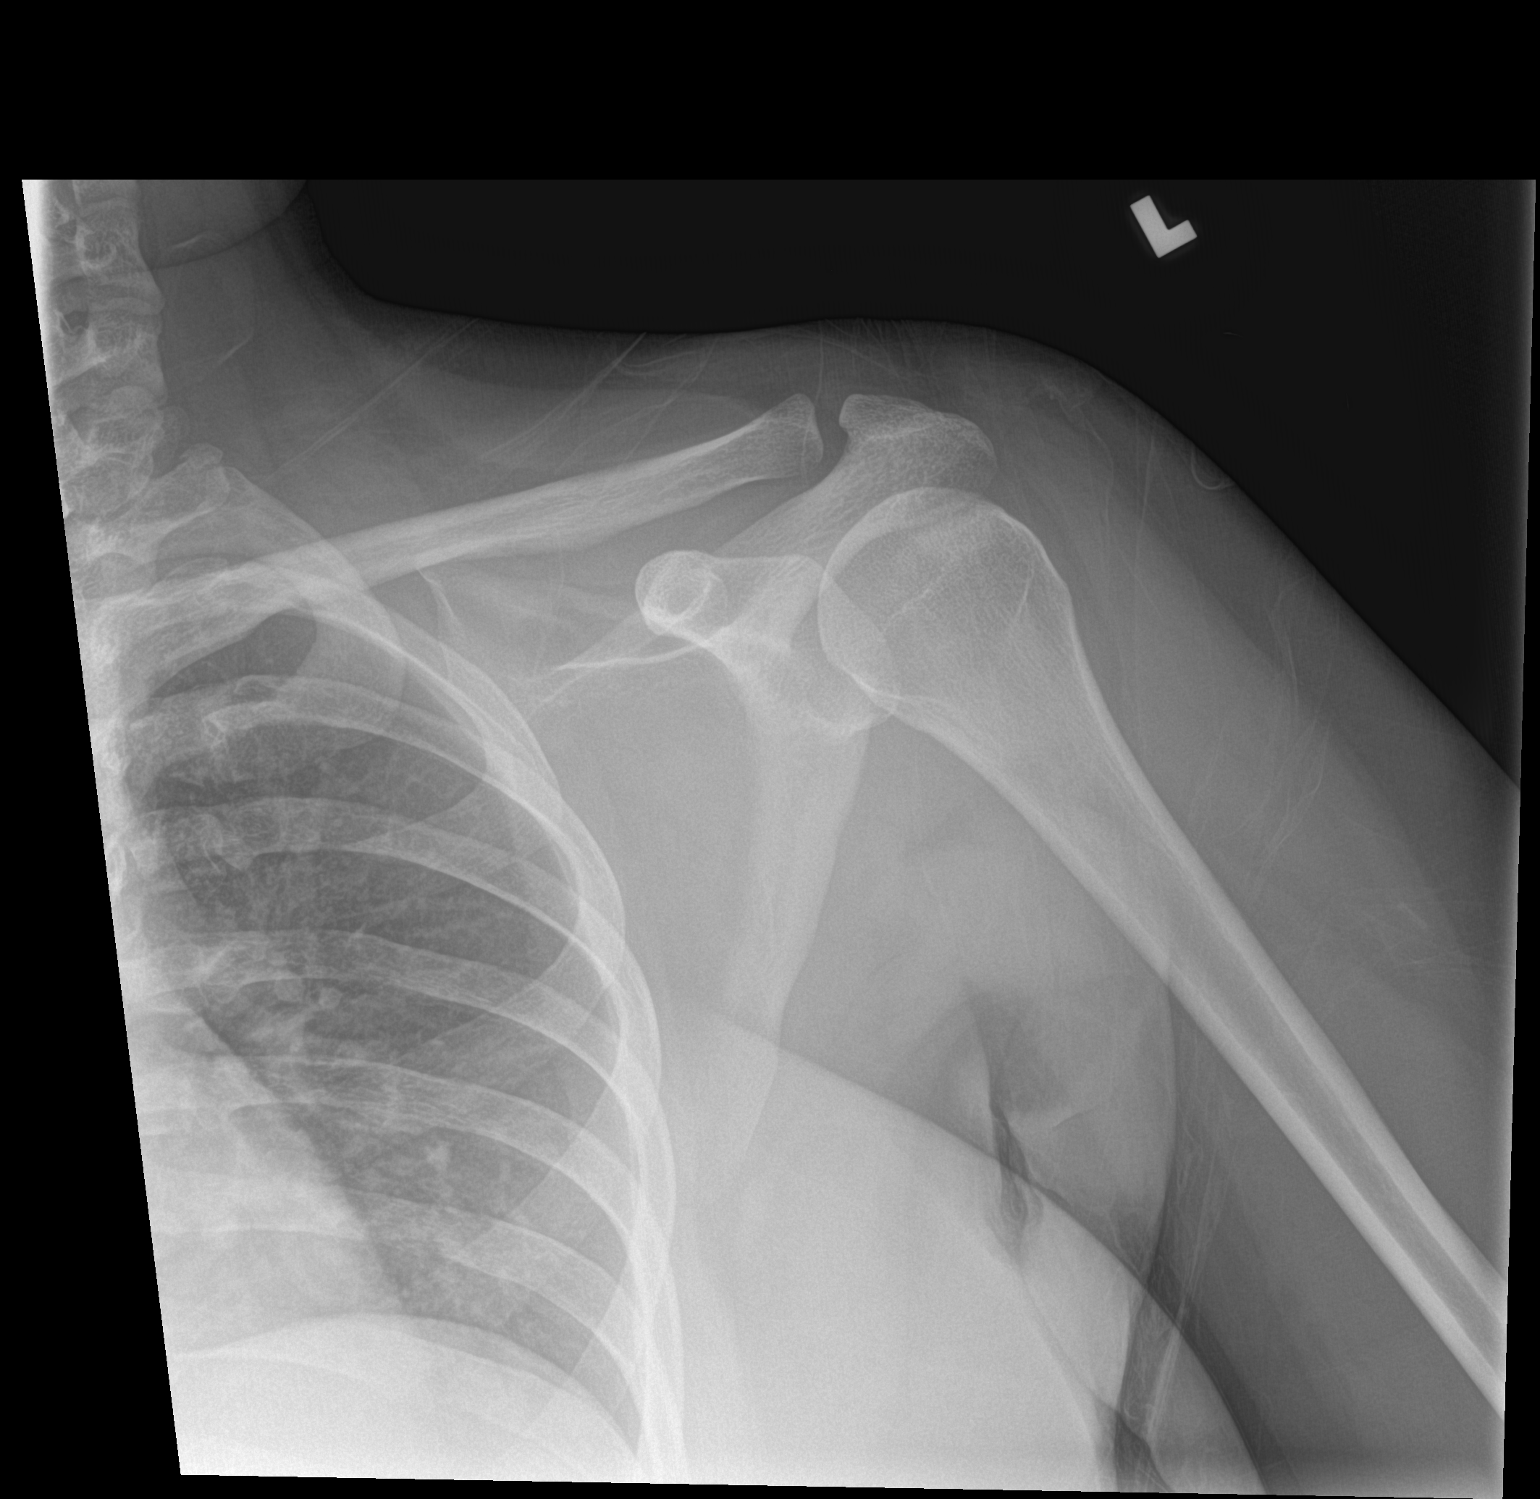

[1 of 1 positions shown; findings below may reference images not displayed]

FINDINGS: There has been interval reduction of previously seen dislocated left
shoulder now appearing in anatomic alignment on this single provided
image. Faint area of lucency in the lateral humeral head may
represent a Hill-Sachs injury. The soft tissues are unremarkable.
IMPRESSION: Interval reduction of the previously seen dislocated left shoulder.

## 2024-03-19 NOTE — Progress Notes (Signed)
 Assessment and Plan:   1. Generalized anxiety disorder (Primary) Try switching Wellbutrin to 150mg  BID and if still not helpful then may take up to 3 tablets in morning to help energy/focus. - buPROPion (WELLBUTRIN XL) 150 mg 24 hr tablet; Take 3 tablets (450 mg total) by mouth daily.  Dispense: 270 tablet; Refill: 1  2. Class 1 obesity due to excess calories without serious comorbidity with body mass index (BMI) of 31.0 to 31.9 in adult Improving with 11 lbs wt loss. - buPROPion (WELLBUTRIN XL) 150 mg 24 hr tablet; Take 3 tablets (450 mg total) by mouth daily.  Dispense: 270 tablet; Refill: 1   Plan: Patient expresses understanding of their current medications and use.  If a new prescription was given today, then I discussed potential side effects, drug interactions, instructions for taking the medication, and the consequences of not taking it.   Patient verbalized an understanding of these instructions. Patient is able to verbalize understanding of the care plan discussed today. Patient's medical and personal goals were discussed today. Barriers to current goals:  None Follow up as discussed in  4 month(s):: CPE Call sooner if needed.   HPI:  Patient returns for ongoing care, monitoring and treatment of their medical conditions.   Cheryl Lambert is a 33 y.o. female here for 3 month follow up anxiety/ wt mgmt  Depression/Anxiety Patient presents for depression/anxiety follow up.  Currently medications reviewed.  Doing well w/o side effects.  Taking medication daily as prescribed.  Current symtpoms: fatigue. Patient denies: Intrusive Thoughts and Racing Heart. Patient states that overall, she feels tired.   Patient staying busy managing 3 Orthodontic clinics.  Wellbutrin really helped energy and mood when she first started it but feels like it wears off now even with increasing to 2 in the morning.  Patient has noticed decreased appetite and is lost 11 pounds since on medicine but  would like to consider adjusting it further.    Past Medical/Surgical History:   Medical History[1] Surgical History[2]  Family History:   Family History[3]  Social History:   Social History[4]  Allergies:   Patient has no known allergies.  Current Medications:   Current Medications[5]  Health Maintenance:    Immunizations:   Immunization History  Administered Date(s) Administered  . Hep B, Adolescent or Pediatric 04/15/2013  . Influenza, Injectable, Quadrivalent, Preservative Free 08/22/2022  . Influenza, Unspecified 06/23/2014, 08/01/2015, 08/03/2016  . Influenza, split virus, trivalent, preservative 08/01/2015, 08/03/2016  . Influenza,split virus, trivalent, PF 07/09/2023  . TD vaccine (ADULT)PF(TENIVAC) 7Y+ 09/28/2005  . TDAP VACCINE (BOOSTRIX,ADACEL) 7Y+ 03/21/2021    I have reviewed and (if needed) updated the patient's problem list, medications, allergies, past medical and surgical history, social and family history.  ROS:   Review of Systems  Constitutional:  Negative for chills, fatigue and fever.  HENT:  Negative for congestion.   Eyes:  Negative for visual disturbance.  Respiratory:  Negative for shortness of breath.   Cardiovascular:  Negative for chest pain and palpitations.  Gastrointestinal:  Negative for abdominal pain.  Endocrine: Negative for cold intolerance and heat intolerance.  Genitourinary:  Negative for difficulty urinating.  Musculoskeletal:  Negative for arthralgias.  Skin:  Negative for rash.  Neurological:  Negative for dizziness and weakness.  Hematological:  Negative for adenopathy.  Psychiatric/Behavioral:  Negative for dysphoric mood.     Vital Signs:   Wt Readings from Last 3 Encounters:  07/09/23 87.1 kg (192 lb)  07/02/23 84.8 kg (187 lb)  12/13/22 84.8 kg (187 lb)   Temp Readings from Last 3 Encounters:  07/09/23 97.1 F (36.2 C)  12/13/22 97.7 F (36.5 C)   BP Readings from Last 3 Encounters:  07/09/23  104/63  07/02/23 112/84  12/13/22 114/86   Pulse Readings from Last 3 Encounters:  07/09/23 82  07/02/23 89  12/13/22 94     There were no vitals taken for this visit. There is no height or weight on file to calculate BMI.    Objective:  Physical Exam General Appearance:  Alert and oriented x 3; cooperative and in no distress.  Well-developed and well-nourished female.  Sitting comfortably and conversing normally. Head:  Normocephalic, without obvious abnormality, atraumatic   Eyes:  Conjunctiva/corneas clear, EOM's intact  Neck: Supple, symmetrical, trachea midline;  no carotid bruits  Lymphatics: Cervical, supraclavicular and axillary nodes normal Thyroid:  No thyroid enlargement/tenderness/nodules Heart: Regular rate and rhythm, S1, S2 normal; no murmur, click, rub or gallop   Lungs: Clear to auscultation bilaterally without wheezing. Musculoskeletal: Normal range of motion of upper and lower extremities.  Muscle strength is grossly normal.  No redness, swelling or tenderness of joints.  No deviation of spine.  No edema. Neurologic: Grossly normal. Cranial nerves II-XII intact, normal strength, sensation and reflexes throughout. Gait is normal. Pulses: 2+ symmetric. Skin: Skin color, texture, turgor normal. No rashes or lesions.  Psychiatric: She has a normal mood and affect.  Her speech is normal and nonpressured.  Thought content is normal.   Recent lab work reviewed and analyzed: Lab Results  Component Value Date   WBC 10.70 07/09/2023   HGB 12.6 07/09/2023   HCT 37.6 07/09/2023   PLT 367 07/09/2023   CHOL 154 07/09/2023   TRIG 95 07/09/2023   HDL 58 (L) 07/09/2023   LDLCALC 78 07/09/2023   ALT 14 07/09/2023   AST 13 07/09/2023   NA 136 07/09/2023   K 4.2 07/09/2023   CL 99 07/09/2023   CREATININE 0.68 07/09/2023   BUN 16 07/09/2023   CO2 30 07/09/2023   TSH 1.471 07/09/2023   GLUCOSE 82 07/09/2023   HGBA1C 5.5 07/09/2023    Recent imaging reviewed and  analyzed: XR Foot Minimum 3 Views Right No acute fracture noted.  Joint spaces well-maintained.  Bipartite tibial  sesamoid noted with similar appearance to previous radiographs taken  several years ago.    This document serves as a record of services personally performed by Katheryn Billing, PA-C.  It was created on their behalf by Rosaline Gauss, CMA, a trained medical scribe, and Certified Medical Assistant (CMA). During the course of documenting the history, physical exam and medical decision making, I was functioning as a Stage manager. The creation of this record is the provider's dictation and/or activities during the visit.   Electronically signed by Rosaline Gauss, CMA 03/19/2024 3:31 PM    I agree the documentation is accurate and complete.        [1] Past Medical History: Diagnosis Date  . ADD (attention deficit disorder)   . Migraine syndrome   . PMDD (premenstrual dysphoric disorder)   . Vitamin D deficiency   [2] Past Surgical History: Procedure Laterality Date  . WISDOM TOOTH EXTRACTION     Procedure: WISDOM TOOTH EXTRACTION  [3] Family History Problem Relation Name Age of Onset  . High Cholesterol Mother    [4] Social History Socioeconomic History  . Marital status: Married  Tobacco Use  . Smoking status: Never  . Smokeless tobacco: Never  Substance and Sexual Activity  . Alcohol use: Yes  . Drug use: No   Social Drivers of Health   Food Insecurity: Low Risk  (12/12/2022)   Food vital sign   . Within the past 12 months, you worried that your food would run out before you got money to buy more: Never true   . Within the past 12 months, the food you bought just didn't last and you didn't have money to get more: Never true  Transportation Needs: No Transportation Needs (12/12/2022)   Transportation   . In the past 12 months, has lack of reliable transportation kept you from medical appointments, meetings, work or from getting things needed for daily  living? : No  Living Situation: Low Risk  (12/12/2022)   Living Situation   . What is your living situation today?: I have a steady place to live   . Think about the place you live. Do you have problems with any of the following? Choose all that apply:: None/None on this list  [5] Current Outpatient Medications  Medication Sig Dispense Refill  . buPROPion (WELLBUTRIN XL) 150 mg 24 hr tablet TAKE 1 TABLET (150 MG TOTAL) BY MOUTH DAILY. MAY INCREASE TO 2 DAY AFTER 2ND WEEK. 180 tablet 1  . cyclobenzaprine  (FLEXERIL ) 10 mg tablet Take 1 tablet (10 mg total) by mouth 3 (three) times a day as needed for muscle spasms. 30 tablet 0  . meloxicam (MOBIC) 15 mg tablet Take 1 tablet (15 mg total) by mouth daily. 30 tablet 0   No current facility-administered medications for this visit.

## 2024-07-06 ENCOUNTER — Inpatient Hospital Stay (HOSPITAL_COMMUNITY)

## 2024-07-06 ENCOUNTER — Encounter (HOSPITAL_COMMUNITY): Payer: Self-pay | Admitting: Obstetrics and Gynecology

## 2024-07-06 ENCOUNTER — Inpatient Hospital Stay (HOSPITAL_COMMUNITY)
Admission: AD | Admit: 2024-07-06 | Discharge: 2024-07-06 | Disposition: A | Discharge status: Home / Self Care | Attending: Obstetrics & Gynecology | Admitting: Obstetrics & Gynecology

## 2024-07-06 DIAGNOSIS — O209 Hemorrhage in early pregnancy, unspecified: Secondary | ICD-10-CM | POA: Diagnosis not present

## 2024-07-06 DIAGNOSIS — O00201 Right ovarian pregnancy without intrauterine pregnancy: Secondary | ICD-10-CM | POA: Insufficient documentation

## 2024-07-06 DIAGNOSIS — Z3A00 Weeks of gestation of pregnancy not specified: Secondary | ICD-10-CM

## 2024-07-06 DIAGNOSIS — O00101 Right tubal pregnancy without intrauterine pregnancy: Secondary | ICD-10-CM | POA: Diagnosis present

## 2024-07-06 LAB — COMPREHENSIVE METABOLIC PANEL WITH GFR
ALT: 16 U/L (ref 0–44)
AST: 16 U/L (ref 15–41)
Albumin: 3.8 g/dL (ref 3.5–5.0)
Alkaline Phosphatase: 63 U/L (ref 38–126)
Anion gap: 8 (ref 5–15)
BUN: 8 mg/dL (ref 6–20)
CO2: 24 mmol/L (ref 22–32)
Calcium: 9.4 mg/dL (ref 8.9–10.3)
Chloride: 106 mmol/L (ref 98–111)
Creatinine, Ser: 0.6 mg/dL (ref 0.44–1.00)
GFR, Estimated: >60 mL/min (ref >60)
Glucose, Bld: 90 mg/dL (ref 70–99)
Potassium: 4.1 mmol/L (ref 3.5–5.1)
Sodium: 138 mmol/L (ref 135–145)
Total Bilirubin: 0.5 mg/dL (ref 0.0–1.2)
Total Protein: 6.9 g/dL (ref 6.5–8.1)

## 2024-07-06 LAB — CBC
HCT: 36.3 % (ref 36.0–46.0)
Hemoglobin: 11.7 g/dL — ABNORMAL LOW (ref 12.0–15.0)
MCH: 29 pg (ref 26.0–34.0)
MCHC: 32.2 g/dL (ref 30.0–36.0)
MCV: 90.1 fL (ref 80.0–100.0)
Platelets: 333 K/uL (ref 150–400)
RBC: 4.03 MIL/uL (ref 3.87–5.11)
RDW: 12.5 % (ref 11.5–15.5)
WBC: 8.6 K/uL (ref 4.0–10.5)
nRBC: 0 % (ref 0.0–0.2)

## 2024-07-06 LAB — HCG, QUANTITATIVE, PREGNANCY: hCG, Beta Chain, Quant, S: 304 m[IU]/mL — ABNORMAL HIGH (ref <5)

## 2024-07-06 MED ORDER — METHOTREXATE FOR ECTOPIC PREGNANCY
50.0000 mg/m2 | Freq: Once | INTRAMUSCULAR | Status: AC
  Administered 2024-07-06: 92.5 mg via INTRAMUSCULAR
  Filled 2024-07-06: qty 3.7

## 2024-07-06 NOTE — Discharge Instructions (Signed)
  The risks of methotrexate were reviewed including failure requiring repeat dosing or eventual surgery. She understands that methotrexate involves frequent return visits to monitor lab values and that she remains at risk of ectopic rupture until her beta is less than assay. ?The patient opts to proceed with methotrexate.  She has no history of hepatic or renal dysfunction, has normal BUN/Cr/LFT's/platelets.  She is felt to be reliable for follow-up. Side effects of photosensitivity & GI upset were discussed.  She knows to avoid direct sunlight and abstain from alcohol, NSAIDs and sexual intercourse for two weeks. She was counseled to discontinue any MVI with folic acid. ?She understands to follow up on D4 (07/09/24) and D7 (07/12/24) for repeat BHCG and was given the instruction sheet. ?Strict ectopic precautions were reviewed, the patient knows to call with any abdominal pain, vomiting, fainting, or any concerns with her health.  Day 0/1 Day 4 Day 7  Sunday Wednesday Saturday  Monday Thursday Sunday  Tuesday Friday Monday  Wednesday Saturday Tuesday  Thursday Sunday Wednesday  Friday Monday Thursday  Saturday Tuesday Friday

## 2024-07-06 NOTE — MAU Provider Note (Signed)
 S [name] is a 33 y.o. G1P0 patient who presents to MAU today with complaint of patient sent from primary OB office in [facility] for evaluation of an ectopic pregnancy seen on ultrasound in primary OBs office.  Patient reports she has had vaginal bleeding with mild abdominal cramping.  She offers no urinary complaints and denies any vaginal discharge, burning, itching or irritation.  Patient reports she only had an ultrasound performed in the office no blood work and is planning on medical management with methotrexate if an ectopic pregnancy is confirmed.  The remainder of the ROS negative unless otherwise noted in HPI   O BP 131/78 (BP Location: Right Arm)   Pulse (!) 103   Temp 98.9 F (37.2 C) (Oral)   Resp 14   Ht 5' 5 (1.651 m)   Wt 75.8 kg   LMP 05/22/2024   SpO2 100%   BMI 27.79 kg/m  Physical Exam Vitals and nursing note reviewed.  Constitutional:      General: She is not in acute distress.    Appearance: Normal appearance. She is not ill-appearing.  HENT:     Head: Normocephalic.     Nose: Nose normal.  Cardiovascular:     Rate and Rhythm: Normal rate.  Pulmonary:     Effort: Pulmonary effort is normal.     Breath sounds: Normal breath sounds.  Abdominal:     Palpations: Abdomen is soft.     Tenderness: There is no abdominal tenderness.  Musculoskeletal:        General: Normal range of motion.     Cervical back: Normal range of motion.  Skin:    General: Skin is warm.  Neurological:     Mental Status: She is alert and oriented to person, place, and time.  Psychiatric:        Mood and Affect: Mood is anxious. Affect is tearful.        Behavior: Behavior normal.    MDM  HIGH  Vaginal bleeding/ abdominal cramping in early pregnacy CBC: NM CMP: Unremarkable HCG: 304 ABO: O Positive OB Ultrasound  (no intrauterine pregnancy was identified, there was a heterogeneously echogenic right adnexal structure consistent with a right ectopic  pregnancy) will proceed with methotrexate management per patient's preference    Differential diagnosis considered for 1st trimester vaginal bleeding includes but is not limited to: ectopic pregnancy, complete spontaneous abortion, incomplete abortion, missed abortion, threatened abortion, embryonic/fetal demise, cervical insufficiency, cervical or vaginal disorder     The risks of methotrexate were reviewed including failure requiring repeat dosing or eventual surgery. She understands that methotrexate involves frequent return visits to monitor lab values and that she remains at risk of ectopic rupture until her beta is less than assay. ?The patient opts to proceed with methotrexate.  She has no history of hepatic or renal dysfunction, has normal BUN/Cr/LFT's/platelets.  She is felt to be reliable for follow-up. Side effects of photosensitivity & GI upset were discussed.  She knows to avoid direct sunlight and abstain from alcohol, NSAIDs and sexual intercourse for two weeks. She was counseled to discontinue any MVI with folic acid. ?She understands to follow up on D4 (07/09/24) and D7 (07/12/24) for repeat BHCG and was given the instruction sheet. ?Strict ectopic precautions were reviewed, the patient knows to call with any abdominal pain, vomiting, fainting, or any concerns with her health.  Day 0/1 Day 4 Day 7  Sunday Wednesday Saturday  Monday Thursday Sunday  Tuesday Friday  Monday  Wednesday Saturday Tuesday  Thursday Sunday Wednesday  Friday Monday Thursday  Saturday Tuesday Friday     Orders Placed This Encounter  Procedures   US  OB LESS THAN 14 WEEKS WITH OB TRANSVAGINAL    Sent from Private OB Office for possible ectopic    Standing Status:   Standing    Number of Occurrences:   1    Symptom/Reason for Exam:   Vaginal spotting [[id number]]   CBC    Standing Status:   Standing    Number of Occurrences:   1   Comprehensive metabolic panel    Standing Status:   Standing    Number of  Occurrences:   1   hCG, quantitative, pregnancy    Standing Status:   Standing    Number of Occurrences:   1   ABO/Rh    Standing Status:   Standing    Number of Occurrences:   1   Discharge patient Discharge disposition: 01-Home or Self Care; Discharge patient date: 07/06/2024    Standing Status:   Standing    Number of Occurrences:   1    Discharge disposition:   01-Home or Self Care [1]    Discharge patient date:   07/06/2024      Results for orders placed or performed during the hospital encounter of 07/06/24 (from the past 24 hours)  CBC     Status: Abnormal   Collection Time: 07/06/24  1:03 PM  Result Value Ref Range   WBC 8.6 4.0 - 10.5 K/uL   RBC 4.03 3.87 - 5.11 MIL/uL   Hemoglobin 11.7 (L) 12.0 - 15.0 g/dL   HCT 63.6 63.9 - 53.9 %   MCV 90.1 80.0 - 100.0 fL   MCH 29.0 26.0 - 34.0 pg   MCHC 32.2 30.0 - 36.0 g/dL   RDW 87.4 88.4 - 84.4 %   Platelets 333 150 - 400 K/uL   nRBC 0.0 0.0 - 0.2 %  Comprehensive metabolic panel     Status: None   Collection Time: 07/06/24  1:03 PM  Result Value Ref Range   Sodium 138 135 - 145 mmol/L   Potassium 4.1 3.5 - 5.1 mmol/L   Chloride 106 98 - 111 mmol/L   CO2 24 22 - 32 mmol/L   Glucose, Bld 90 70 - 99 mg/dL   BUN 8 6 - 20 mg/dL   Creatinine, Ser 9.39 0.44 - 1.00 mg/dL   Calcium 9.4 8.9 - 89.6 mg/dL   Total Protein 6.9 6.5 - 8.1 g/dL   Albumin 3.8 3.5 - 5.0 g/dL   AST 16 15 - 41 U/L   ALT 16 0 - 44 U/L   Alkaline Phosphatase 63 38 - 126 U/L   Total Bilirubin 0.5 0.0 - 1.2 mg/dL   GFR, Estimated >39 >39 mL/min   Anion gap 8 5 - 15  hCG, quantitative, pregnancy     Status: Abnormal   Collection Time: 07/06/24  1:03 PM  Result Value Ref Range   hCG, Beta Chain, Quant, S 304 (H) <5 mIU/mL  ABO/Rh     Status: None   Collection Time: 07/06/24  1:03 PM  Result Value Ref Range   ABO/RH(D)      O POS Performed at [facility], [address], [place], [place] [address]      Study Result  Narrative & Impression   CLINICAL DATA:  Known ectopic pregnancy   EXAM: OBSTETRIC <14 WK US  AND TRANSVAGINAL OB  US    TECHNIQUE: Both transabdominal and transvaginal ultrasound examinations were performed for complete evaluation of the gestation as well as the maternal uterus, adnexal regions, and pelvic cul-de-sac. Transvaginal technique was performed to assess early pregnancy.   COMPARISON:  None Available.   FINDINGS: Intrauterine gestational sac: None   Yolk sac:  Not Visualized.   Embryo:  Not Visualized.   Subchorionic hemorrhage:  None visualized.   Maternal uterus/adnexae: Nabothian cysts. Located medial to and separate from the right ovary is a heterogeneously echogenic, thick-walled cystic structure measuring 4.5 x 1.6 x 1.5 cm. Normal left ovary. Small volume right adnexal free fluid.   IMPRESSION: Heterogeneously echogenic right adnexal structure, separate from and medial to the right ovary, is in keeping with known ectopic pregnancy, inseparable from presumed adjacent blood clot.   Critical Value/emergent results were called by telephone at the time of interpretation on 07/06/2024 at 1:41 pm to provider OLAM DALTON , who verbally acknowledged these results.     Electronically Signed   By: Limin  Xu M.D.   On: 07/06/2024 13:42     I have reviewed the patient chart and performed the physical exam . I have ordered & interpreted the lab results and reviewed and interpreted the ultrasound images Medications ordered as stated below.  A/P as described below.  Counseling and education provided and patient agreeable  with plan as described below. Verbalized understanding.    ASSESSMENT Medical screening exam complete Right ovarian pregnancy without intrauterine pregnancy  Vaginal bleeding affecting early pregnancy     PLAN   Patient has f/u appointment scheduled at Primary OB's office on 10/9 for D#4 HCG labs and will return to the MAU on D#7 Sunday 10/12   Discharge from MAU  in stable condition  See AVS for full description of educational information and instructions provided to the patient at time of discharge  Warning signs for worsening condition that would warrant emergency follow-up discussed Patient may return to MAU as needed   DALTON OLAM LABOR, NP 07/06/2024 4:26 PM   This chart was dictated using voice recognition software, Dragon. Despite the best efforts of this provider to proofread and correct errors, errors may still occur which can change documentation meaning.

## 2024-07-06 NOTE — MAU Note (Signed)
..  Cheryl Lambert is a 33 y.o. at [redacted]w[redacted]d here in MAU reporting: sent from office for ectopic pregnancy. Needs labs and methotrexate. She states that it was located in her right tube. Scheduled to follow up in the office on Thursday for repeat hcg. Does report a small amount of vaginal bleeding and lower abdominal cramping.  LMP: 05/22/24 Pain score: 1 Vitals:   07/06/24 1224  BP: 131/78  Pulse: (!) 103  Resp: 14  Temp: 98.9 F (37.2 C)  SpO2: 100%     FHT:n/a

## 2024-07-07 LAB — ABO/RH: ABO/RH(D): O POS

## 2024-07-08 ENCOUNTER — Ambulatory Visit (HOSPITAL_COMMUNITY): Admission: RE | Admit: 2024-07-08 | Discharge: 2024-07-08 | Disposition: A | Discharge status: Home / Self Care

## 2024-07-08 ENCOUNTER — Ambulatory Visit (HOSPITAL_COMMUNITY): Admitting: Certified Registered Nurse Anesthetist

## 2024-07-08 ENCOUNTER — Other Ambulatory Visit: Payer: Self-pay

## 2024-07-08 ENCOUNTER — Encounter (HOSPITAL_COMMUNITY): Payer: Self-pay

## 2024-07-08 ENCOUNTER — Ambulatory Visit (HOSPITAL_COMMUNITY)

## 2024-07-08 ENCOUNTER — Encounter (HOSPITAL_COMMUNITY): Admission: RE | Disposition: A | Discharge status: Home / Self Care | Payer: Self-pay | Source: Home / Self Care

## 2024-07-08 DIAGNOSIS — O00101 Right tubal pregnancy without intrauterine pregnancy: Secondary | ICD-10-CM | POA: Insufficient documentation

## 2024-07-08 DIAGNOSIS — Z79899 Other long term (current) drug therapy: Secondary | ICD-10-CM | POA: Diagnosis not present

## 2024-07-08 DIAGNOSIS — O009 Unspecified ectopic pregnancy without intrauterine pregnancy: Secondary | ICD-10-CM | POA: Diagnosis present

## 2024-07-08 DIAGNOSIS — O00109 Unspecified tubal pregnancy without intrauterine pregnancy: Secondary | ICD-10-CM

## 2024-07-08 DIAGNOSIS — Z539 Procedure and treatment not carried out, unspecified reason: Secondary | ICD-10-CM | POA: Insufficient documentation

## 2024-07-08 SURGERY — LAPAROSCOPY, WITH ECTOPIC PREGNANCY SURGICAL TREATMENT
Anesthesia: General

## 2024-07-08 MED ORDER — ROPIVACAINE HCL 5 MG/ML IJ SOLN
INTRAMUSCULAR | Status: AC
  Filled 2024-07-08: qty 30

## 2024-07-08 MED ORDER — ORAL CARE MOUTH RINSE: 15.0000 mL | Freq: Once | OROMUCOSAL | Status: DC

## 2024-07-08 MED ORDER — BUPIVACAINE HCL (PF) 0.25 % IJ SOLN
INTRAMUSCULAR | Status: AC
  Filled 2024-07-08: qty 10

## 2024-07-08 MED ORDER — LACTATED RINGERS IV SOLN: INTRAVENOUS | Status: DC

## 2024-07-08 MED ORDER — CHLORHEXIDINE GLUCONATE 0.12 % MT SOLN: 15.0000 mL | Freq: Once | OROMUCOSAL | Status: DC

## 2024-07-08 MED ORDER — SODIUM CHLORIDE (PF) 0.9 % IJ SOLN
INTRAMUSCULAR | Status: AC
  Filled 2024-07-08: qty 30

## 2024-07-08 NOTE — H&P (Signed)
 Cheryl Lambert is an 33 y.o. female. G1P0 at [redacted]w[redacted]d with known ectopic pregnancy.  Patient seen in the office on 10/6 and found to have ectopic pregnancy in right ectopic. At her visit, noted vaginal bleeding started on 9/29. She was still wearing liner and changing q2-3hr. Reported mild cramps across lower abdomen. Denied N/V, fevers, chills, foul smelling discharge, light-headedness or dizziness. She presented to MAU for methotrexate injection on 10/6.  Pain called the office today reporting severe cramping. Still reports minimal bleeding. Denies lightheadedness or dizziness.   Menstrual History:  Patient's last menstrual period was 05/22/2024.    Past Medical History:  Diagnosis Date   Attention deficit     No past surgical history on file.  No family history on file.  Social History:  reports that she has never smoked. She has never used smokeless tobacco. She reports that she does not drink alcohol and does not use drugs.  Allergies: No Known Allergies  Medications Prior to Admission  Medication Sig Dispense Refill Last Dose/Taking   cyclobenzaprine  (FLEXERIL ) 10 MG tablet Take 1 tablet (10 mg total) by mouth 2 (two) times daily as needed for muscle spasms. 20 tablet 0    lisdexamfetamine (VYVANSE) 20 MG capsule Take 20 mg by mouth daily.       Review of Systems  Last menstrual period 05/22/2024. Physical Exam  No results found for this or any previous visit (from the past 24 hours).  No results found.  Assessment/Plan:  Ectopic Pregnancy - s/p methotrexate with increasing pain - US  per patient request showed *** - CBC, T&S and HCG ordered - Plan to proceed to OR for diagnostic [facility] with evacuation of ectopic pregnancy, possible unilateral salpingectomy or oophorectomy.  Charmaine HERO Siani Utke 07/08/2024, 5:09 PM

## 2024-07-08 NOTE — Progress Notes (Signed)
 Dr Lane came in and talked to the pt regarding the result of US .  Surgery is cancelled.

## 2024-07-08 NOTE — Anesthesia Preprocedure Evaluation (Addendum)
 Anesthesia Evaluation  Patient identified by MRN, date of birth, ID band Patient awake    Reviewed: Allergy & Precautions, NPO status , Patient's Chart, lab work & pertinent test results  History of Anesthesia Complications Negative for: history of anesthetic complications  Airway Mallampati: I  TM Distance: >3 FB Neck ROM: Full    Dental  (+) Dental Advisory Given   Pulmonary neg pulmonary ROS   breath sounds clear to auscultation       Cardiovascular negative cardio ROS  Rhythm:Regular Rate:Normal     Neuro/Psych  PSYCHIATRIC DISORDERS (ADHD)      negative neurological ROS     GI/Hepatic negative GI ROS, Neg liver ROS,,,  Endo/Other  negative endocrine ROS    Renal/GU negative Renal ROS     Musculoskeletal   Abdominal   Peds  Hematology Hb 11.7, plt 333k   Anesthesia Other Findings   Reproductive/Obstetrics (+) Pregnancy (ectopic)                              Anesthesia Physical Anesthesia Plan  ASA: 1  Anesthesia Plan: General   Post-op Pain Management: Ofirmev  IV (intra-op)*   Induction: Intravenous and Rapid sequence  PONV Risk Score and Plan: 3 and Ondansetron , Dexamethasone and Scopolamine patch - Pre-op  Airway Management Planned: Oral ETT  Additional Equipment: None  Intra-op Plan:   Post-operative Plan: Extubation in OR  Informed Consent: I have reviewed the patients History and Physical, chart, labs and discussed the procedure including the risks, benefits and alternatives for the proposed anesthesia with the patient or authorized representative who has indicated his/her understanding and acceptance.     Dental advisory given  Plan Discussed with: CRNA and Surgeon  Anesthesia Plan Comments:          Anesthesia Quick Evaluation

## 2024-07-12 ENCOUNTER — Other Ambulatory Visit: Payer: Self-pay

## 2024-07-12 ENCOUNTER — Inpatient Hospital Stay (HOSPITAL_COMMUNITY)
Admission: AD | Admit: 2024-07-12 | Discharge: 2024-07-12 | Disposition: A | Discharge status: Home / Self Care | Payer: Self-pay | Attending: Obstetrics & Gynecology | Admitting: Obstetrics & Gynecology

## 2024-07-12 DIAGNOSIS — O00101 Right tubal pregnancy without intrauterine pregnancy: Secondary | ICD-10-CM

## 2024-07-12 DIAGNOSIS — Z3A01 Less than 8 weeks gestation of pregnancy: Secondary | ICD-10-CM

## 2024-07-12 LAB — COMPREHENSIVE METABOLIC PANEL WITH GFR
ALT: 15 U/L (ref 0–44)
AST: 24 U/L (ref 15–41)
Albumin: 3.7 g/dL (ref 3.5–5.0)
Alkaline Phosphatase: 58 U/L (ref 38–126)
Anion gap: 7 (ref 5–15)
BUN: 10 mg/dL (ref 6–20)
CO2: 25 mmol/L (ref 22–32)
Calcium: 9 mg/dL (ref 8.9–10.3)
Chloride: 103 mmol/L (ref 98–111)
Creatinine, Ser: 0.69 mg/dL (ref 0.44–1.00)
GFR, Estimated: >60 mL/min (ref >60)
Glucose, Bld: 83 mg/dL (ref 70–99)
Potassium: 4.8 mmol/L (ref 3.5–5.1)
Sodium: 135 mmol/L (ref 135–145)
Total Bilirubin: 1.1 mg/dL (ref 0.0–1.2)
Total Protein: 6.5 g/dL (ref 6.5–8.1)

## 2024-07-12 LAB — HCG, QUANTITATIVE, PREGNANCY: hCG, Beta Chain, Quant, S: 423 m[IU]/mL — ABNORMAL HIGH (ref <5)

## 2024-07-12 MED ORDER — METHOTREXATE FOR ECTOPIC PREGNANCY
50.0000 mg/m2 | Freq: Once | INTRAMUSCULAR | Status: AC
  Administered 2024-07-12: 92.5 mg via INTRAMUSCULAR
  Filled 2024-07-12: qty 3.7

## 2024-07-12 NOTE — MAU Note (Signed)
.  Cheryl Lambert is a 33 y.o. at [redacted]w[redacted]d here in MAU reporting: Patient reports here for repeat blood work. Would like ultrasound today to see if her ectopic pregnancy has ruptured pain is 1/10 scabt vaginal bleeding  Onset of complaint:  Pain score: 1/10 There were no vitals filed for this visit.    Lab orders placed from triage:   none

## 2024-07-12 NOTE — MAU Provider Note (Signed)
 History   Chief Complaint:  Follow-up   Cheryl Lambert is a 33 y.o. G1P0 at [redacted]w[redacted]d who presents for labs. Reports episode of abdominal cramping on Friday night that has since resolved. Denies vaginal bleeding.   Physical Exam   Blood pressure 110/69, pulse 91, temperature 97.8 F (36.6 C), temperature source Oral, resp. rate 16, height 5' 5 (1.651 m), weight 76.6 kg, last menstrual period 05/22/2024, SpO2 100%.  Physical Examination: General appearance - alert, well appearing, and in no distress Mental status - alert, oriented to person, place, and time Eyes - pupils equal and reactive, extraocular eye movements intact, sclera anicteric Chest - normal respiratory effort  Labs: Results for orders placed or performed during the hospital encounter of 07/12/24 (from the past 24 hours)  hCG, quantitative, pregnancy   Collection Time: 07/12/24 11:23 AM  Result Value Ref Range   hCG, Beta Chain, Quant, S 423 (H) <5 mIU/mL  Comprehensive metabolic panel   Collection Time: 07/12/24 11:23 AM  Result Value Ref Range   Sodium 135 135 - 145 mmol/L   Potassium 4.8 3.5 - 5.1 mmol/L   Chloride 103 98 - 111 mmol/L   CO2 25 22 - 32 mmol/L   Glucose, Bld 83 70 - 99 mg/dL   BUN 10 6 - 20 mg/dL   Creatinine, Ser 9.30 0.44 - 1.00 mg/dL   Calcium 9.0 8.9 - 89.6 mg/dL   Total Protein 6.5 6.5 - 8.1 g/dL   Albumin 3.7 3.5 - 5.0 g/dL   AST 24 15 - 41 U/L   ALT 15 0 - 44 U/L   Alkaline Phosphatase 58 38 - 126 U/L   Total Bilirubin 1.1 0.0 - 1.2 mg/dL   GFR, Estimated >39 >39 mL/min   Anion gap 7 5 - 15    Ultrasound Studies:   No results found.  Assessment:   1. Right tubal pregnancy without intrauterine pregnancy   2. [redacted] weeks gestation of pregnancy     Day 1=304 Day 2=340 Day 7=423 Reviewed with Dr. Gretta. Will give second dose of methotrexate.   Plan: -Discharge home in stable condition -Ectopic precautions discussed -Patient advised to follow-up with [facility] on Wednesday for  day 4 hcg -Patient may return to MAU as needed or if her condition were to change or worsen  Rocky Satterfield, NP 07/12/2024, 6:49 PM
